# Patient Record
Sex: Male | Born: 1995 | Race: Black or African American | Hispanic: No | Marital: Single | Smoking: Never smoker
Health system: Southern US, Community
[De-identification: ages and names within clinical notes are randomized; demographics above are authoritative.]

## PROBLEM LIST (undated history)

## (undated) DIAGNOSIS — J302 Other seasonal allergic rhinitis: Secondary | ICD-10-CM

---
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
## 2014-10-14 ENCOUNTER — Emergency Department (HOSPITAL_COMMUNITY)
Admission: EM | Admit: 2014-10-14 | Discharge: 2014-10-14 | Disposition: A | Discharge status: Home / Self Care | Payer: Medicaid Other | Attending: Emergency Medicine | Admitting: Emergency Medicine

## 2014-10-14 ENCOUNTER — Encounter (HOSPITAL_COMMUNITY): Payer: Self-pay | Admitting: Cardiology

## 2014-10-14 ENCOUNTER — Emergency Department (HOSPITAL_COMMUNITY): Payer: Medicaid Other

## 2014-10-14 DIAGNOSIS — Z880 Allergy status to penicillin: Secondary | ICD-10-CM | POA: Insufficient documentation

## 2014-10-14 DIAGNOSIS — E119 Type 2 diabetes mellitus without complications: Secondary | ICD-10-CM | POA: Insufficient documentation

## 2014-10-14 DIAGNOSIS — Z79899 Other long term (current) drug therapy: Secondary | ICD-10-CM | POA: Insufficient documentation

## 2014-10-14 DIAGNOSIS — R0789 Other chest pain: Secondary | ICD-10-CM | POA: Insufficient documentation

## 2014-10-14 LAB — I-STAT CHEM 8, ED
BUN: 13 mg/dL (ref 6–23)
CREATININE: 0.8 mg/dL (ref 0.50–1.35)
Calcium, Ion: 1.24 mmol/L — ABNORMAL HIGH (ref 1.12–1.23)
Chloride: 103 mmol/L (ref 96–112)
GLUCOSE: 98 mg/dL (ref 70–99)
HCT: 51 % (ref 39.0–52.0)
Hemoglobin: 17.3 g/dL — ABNORMAL HIGH (ref 13.0–17.0)
POTASSIUM: 4.1 mmol/L (ref 3.5–5.1)
SODIUM: 141 mmol/L (ref 135–145)
TCO2: 22 mmol/L (ref 0–100)

## 2014-10-14 LAB — I-STAT TROPONIN, ED: Troponin i, poc: 0 ng/mL (ref 0.00–0.08)

## 2014-10-14 MED ORDER — IBUPROFEN 200 MG PO TABS: 200.0000 mg | ORAL_TABLET | Freq: Four times a day (QID) | ORAL | Status: AC | PRN

## 2014-10-14 MED ORDER — RANITIDINE HCL 150 MG PO TABS: 150.0000 mg | ORAL_TABLET | Freq: Two times a day (BID) | ORAL | Status: AC

## 2014-10-14 MED ORDER — OXYCODONE-ACETAMINOPHEN 5-325 MG PO TABS
1.0000 | ORAL_TABLET | Freq: Once | ORAL | Status: AC
  Administered 2014-10-14: 1 via ORAL
  Filled 2014-10-14: qty 1

## 2014-10-14 NOTE — ED Provider Notes (Signed)
CSN: [id number]     Arrival date & time 10/14/14  1044 History   First MD Initiated Contact with Patient 10/14/14 1100     Chief Complaint  Patient presents with  . Chest Pain     (Consider location/radiation/quality/duration/timing/severity/associated sxs/prior Treatment) HPI Bradley Lucero is a 19 y.o. male comes in for evaluation of chest discomfort. Patient states he has had chest discomfort intermittently for the past 2 or 3 weeks. His most recent episode occurred this morning at 8:00 AM he characterizes as a left chest pressure sensation that lasted approximately one hour. Nothing made it better or worse. He reports associated shortness of breath during the event. He denies headache, vision changes, diaphoresis, numbness or weakness, syncope, abdominal pain, nausea or vomiting. He reports discomfort now is a 3/10. PERC negative.  Past Medical History  Diagnosis Date  . Seasonal allergies   . Diabetes mellitus without complication     took metformin as a child, no longer has DM   Past Surgical History  Procedure Laterality Date  . Nasal septoplasty w/ turbinoplasty  05/05/2012    Procedure: NASAL SEPTOPLASTY WITH TURBINATE REDUCTION;  Surgeon: Darletta MollSui W Teoh, MD;  Location: [facility];  Service: ENT;  Laterality: Bilateral;   History reviewed. No pertinent family history. History  Substance Use Topics  . Smoking status: Never Smoker   . Smokeless tobacco: Never Used  . Alcohol Use: No    Review of Systems A 10 point review of systems was completed and was negative except for pertinent positives and negatives as mentioned in the history of present illness    Allergies  Shellfish allergy and Penicillins  Home Medications   Prior to Admission medications   Medication Sig Start Date End Date Taking? Authorizing Provider  Ascorbic Acid (VITAMIN C PO) Take 1 tablet by mouth daily.   Yes Historical Provider, MD  HYDROcodone-acetaminophen (NORCO/VICODIN) 5-325 MG  per tablet Take 1-2 tablets by mouth every 6 (six) hours as needed for pain. Patient not taking: Reported on 10/14/2014 05/05/12   Newman PiesSu Teoh, MD  ibuprofen (ADVIL,MOTRIN) 200 MG tablet Take 1 tablet (200 mg total) by mouth every 6 (six) hours as needed for mild pain or moderate pain. 10/14/14   Joycie PeekBenjamin Dewanna Hurston, PA-C  ranitidine (ZANTAC) 150 MG tablet Take 1 tablet (150 mg total) by mouth 2 (two) times daily. 10/14/14   Raju Coppolino, PA-C   BP 139/66 mmHg  Pulse 70  Temp(Src) 98.1 F (36.7 C) (Oral)  Resp 14  Wt 218 lb 5 oz (99.026 kg)  SpO2 98% Physical Exam  Constitutional: He is oriented to person, place, and time. He appears well-developed and well-nourished.  HENT:  Head: Normocephalic and atraumatic.  Mouth/Throat: Oropharynx is clear and moist.  Eyes: Conjunctivae are normal. Pupils are equal, round, and reactive to light. Right eye exhibits no discharge. Left eye exhibits no discharge. No scleral icterus.  Neck: Normal range of motion. Neck supple.  Cardiovascular: Normal rate, regular rhythm and normal heart sounds.  Exam reveals no gallop and no friction rub.   No murmur heard. Pulmonary/Chest: Effort normal and breath sounds normal. No respiratory distress. He has no wheezes. He has no rales. He exhibits no tenderness.  Abdominal: Soft. He exhibits no distension and no mass. There is no tenderness. There is no rebound and no guarding.  Musculoskeletal: He exhibits no tenderness.  Bilateral lower extremities symmetric without appreciable edema, erythema. No tenderness along the deep venous system.  Neurological: He is alert and  oriented to person, place, and time.  Cranial Nerves II-XII grossly intact  Skin: Skin is warm and dry. No rash noted.  Psychiatric: He has a normal mood and affect.  Nursing note and vitals reviewed.   ED Course  Procedures (including critical care time) Labs Review Labs Reviewed  I-STAT CHEM 8, ED - Abnormal; Notable for the following:     Calcium, Ion 1.24 (*)    Hemoglobin 17.3 (*)    All other components within normal limits  I-STAT TROPOININ, ED    Imaging Review Dg Chest 2 View  10/14/2014   CLINICAL DATA:  Chest pain  EXAM: CHEST  2 VIEW  COMPARISON:  None.  FINDINGS: The heart size and mediastinal contours are within normal limits. Both lungs are clear. The visualized skeletal structures are unremarkable.  IMPRESSION: No active cardiopulmonary disease.   Electronically Signed   By: Natasha Mead M.D.   On: 10/14/2014 12:45     EKG Interpretation   Date/Time:  Thursday October 14 2014 10:48:59 EDT Ventricular Rate:  102 PR Interval:  120 QRS Duration: 106 QT Interval:  330 QTC Calculation: 430 R Axis:   100 Text Interpretation:  Sinus tachycardia Rightward axis non-spec t wave  abn's Confirmed by HARRISON  MD, FORREST ([id number]) on 10/14/2014 11:23:32 AM     Meds given in ED:  Medications  oxyCODONE-acetaminophen (PERCOCET/ROXICET) 5-325 MG per tablet 1 tablet (1 tablet Oral Given 10/14/14 1333)    New Prescriptions   RANITIDINE (ZANTAC) 150 MG TABLET    Take 1 tablet (150 mg total) by mouth 2 (two) times daily.   Filed Vitals:   10/14/14 1133 10/14/14 1145 10/14/14 1345 10/14/14 1430  BP: 143/83 142/81 142/78 139/66  Pulse: 89 82 87 70  Temp:      TempSrc:      Resp: Weight:      SpO2: 99% 98% 100% 98%    MDM  Vitals stable - WNL -afebrile Pt resting comfortably in ED.  PE--Lung exam normal. Cardiac auscultation reveals no murmurs rubs or gallops. Grossly Benign Physical Exam Labwork: Troponin negative. EKG with nonspecific T-wave inversions--discussed with Dr. Romeo Apple and not concerning. Labs otherwise noncontributory Imaging: CXR shows no acute cardiopulmonary pathology  DDX: Patient with nonspecific/atypical chest discomfort for 2-3 weeks. Clinical picture and exam today not consistent with ACS/dissection. Heart score 1. No evidence of spontaneous pneumothorax, esophageal rupture or  other mediastinitis. PERC negative, doubt PE. No evidence of myocarditis, endocarditis, pericarditis.  Will DC with Zantac and anti-inflammatories and instructions to f/u with PCP. I discussed all relevant lab findings and imaging results with pt and they verbalized understanding. Discussed f/u with PCP within 48 hrs and return precautions, pt very amenable to plan. Prior to patient discharge, I discussed and reviewed this case with Dr. Romeo Apple, who also saw and evaluated the patient   Final diagnoses:  Chest discomfort        Joycie Peek, PA-C 10/14/14 2118  Purvis Sheffield, MD 10/14/14 2124

## 2014-10-14 NOTE — Discharge Instructions (Signed)
Chest Pain (Nonspecific) °It is often hard to give a specific diagnosis for the cause of chest pain. There is always a chance that your pain could be related to something serious, such as a heart attack or a blood clot in the lungs. You need to follow up with your health care provider for further evaluation. °CAUSES  °· Heartburn. °· Pneumonia or bronchitis. °· Anxiety or stress. °· Inflammation around your heart (pericarditis) or lung (pleuritis or pleurisy). °· A blood clot in the lung. °· A collapsed lung (pneumothorax). It can develop suddenly on its own (spontaneous pneumothorax) or from trauma to the chest. °· Shingles infection (herpes zoster virus). °The chest wall is composed of bones, muscles, and cartilage. Any of these can be the source of the pain. °· The bones can be bruised by injury. °· The muscles or cartilage can be strained by coughing or overwork. °· The cartilage can be affected by inflammation and become sore (costochondritis). °DIAGNOSIS  °Lab tests or other studies may be needed to find the cause of your pain. Your health care provider may have you take a test called an ambulatory electrocardiogram (ECG). An ECG records your heartbeat patterns over a 24-hour period. You may also have other tests, such as: °· Transthoracic echocardiogram (TTE). During echocardiography, sound waves are used to evaluate how blood flows through your heart. °· Transesophageal echocardiogram (TEE). °· Cardiac monitoring. This allows your health care provider to monitor your heart rate and rhythm in real time. °· Holter monitor. This is a portable device that records your heartbeat and can help diagnose heart arrhythmias. It allows your health care provider to track your heart activity for several days, if needed. °· Stress tests by exercise or by giving medicine that makes the heart beat faster. °TREATMENT  °· Treatment depends on what may be causing your chest pain. Treatment may include: °¨ Acid blockers for  heartburn. °¨ Anti-inflammatory medicine. °¨ Pain medicine for inflammatory conditions. °¨ Antibiotics if an infection is present. °· You may be advised to change lifestyle habits. This includes stopping smoking and avoiding alcohol, caffeine, and chocolate. °· You may be advised to keep your head raised (elevated) when sleeping. This reduces the chance of acid going backward from your stomach into your esophagus. °Most of the time, nonspecific chest pain will improve within 2-3 days with rest and mild pain medicine.  °HOME CARE INSTRUCTIONS  °· If antibiotics were prescribed, take them as directed. Finish them even if you start to feel better. °· For the next few days, avoid physical activities that bring on chest pain. Continue physical activities as directed. °· Do not use any tobacco products, including cigarettes, chewing tobacco, or electronic cigarettes. °· Avoid drinking alcohol. °· Only take medicine as directed by your health care provider. °· Follow your health care provider's suggestions for further testing if your chest pain does not go away. °· Keep any follow-up appointments you made. If you do not go to an appointment, you could develop lasting (chronic) problems with pain. If there is any problem keeping an appointment, call to reschedule. °SEEK MEDICAL CARE IF:  °· Your chest pain does not go away, even after treatment. °· You have a rash with blisters on your chest. °· You have a fever. °SEEK IMMEDIATE MEDICAL CARE IF:  °· You have increased chest pain or pain that spreads to your arm, neck, jaw, back, or abdomen. °· You have shortness of breath. °· You have an increasing cough, or you cough   up blood.  You have severe back or abdominal pain.  You feel nauseous or vomit.  You have severe weakness.  You faint.  You have chills. This is an emergency. Do not wait to see if the pain will go away. Get medical help at once. Call your local emergency services (911 in [place] Do not drive  yourself to the hospital. MAKE SURE YOU:   Understand these instructions.  Will watch your condition.  Will get help right away if you are not doing well or get worse. Document Released: 03/21/2005 Document Revised: 06/16/2013 Document Reviewed: 01/15/2008 [facility] Patient Information 2015 [employer] [place]. This information is not intended to replace advice given to you by your health care provider. Make sure you discuss any questions you have with your health care provider. You were evaluated in the ED today for your chest discomfort. There does not appear to be an emergent cause for your symptoms at this time. It is important. Follow-up with primary care for further evaluation and management of your symptoms. Please take all your medications as directed. Return to ED for new or worsening symptoms.

## 2014-10-14 NOTE — ED Notes (Signed)
Pt reports chest pain that started a couple of days ago. Reports some SOB with the pain but no nausea. Denies any hx of the same.

## 2014-11-04 ENCOUNTER — Emergency Department (HOSPITAL_COMMUNITY)
Admission: EM | Admit: 2014-11-04 | Discharge: 2014-11-05 | Disposition: A | Discharge status: Home / Self Care | Payer: No Typology Code available for payment source | Attending: Emergency Medicine | Admitting: Emergency Medicine

## 2014-11-04 ENCOUNTER — Emergency Department (HOSPITAL_COMMUNITY): Payer: No Typology Code available for payment source

## 2014-11-04 ENCOUNTER — Encounter (HOSPITAL_COMMUNITY): Payer: Self-pay | Admitting: Emergency Medicine

## 2014-11-04 DIAGNOSIS — Y9389 Activity, other specified: Secondary | ICD-10-CM | POA: Insufficient documentation

## 2014-11-04 DIAGNOSIS — S199XXA Unspecified injury of neck, initial encounter: Secondary | ICD-10-CM | POA: Diagnosis present

## 2014-11-04 DIAGNOSIS — Y998 Other external cause status: Secondary | ICD-10-CM | POA: Diagnosis not present

## 2014-11-04 DIAGNOSIS — S8011XA Contusion of right lower leg, initial encounter: Secondary | ICD-10-CM | POA: Diagnosis not present

## 2014-11-04 DIAGNOSIS — Y92410 Unspecified street and highway as the place of occurrence of the external cause: Secondary | ICD-10-CM | POA: Insufficient documentation

## 2014-11-04 DIAGNOSIS — Z79899 Other long term (current) drug therapy: Secondary | ICD-10-CM | POA: Diagnosis not present

## 2014-11-04 DIAGNOSIS — S161XXA Strain of muscle, fascia and tendon at neck level, initial encounter: Secondary | ICD-10-CM | POA: Diagnosis not present

## 2014-11-04 DIAGNOSIS — Z880 Allergy status to penicillin: Secondary | ICD-10-CM | POA: Diagnosis not present

## 2014-11-04 DIAGNOSIS — S8012XA Contusion of left lower leg, initial encounter: Secondary | ICD-10-CM | POA: Diagnosis not present

## 2014-11-04 DIAGNOSIS — E119 Type 2 diabetes mellitus without complications: Secondary | ICD-10-CM | POA: Diagnosis not present

## 2014-11-04 LAB — CBC
HCT: 43.3 % (ref 39.0–52.0)
Hemoglobin: 14.8 g/dL (ref 13.0–17.0)
MCH: 25.4 pg — AB (ref 26.0–34.0)
MCHC: 34.2 g/dL (ref 30.0–36.0)
MCV: 74.3 fL — AB (ref 78.0–100.0)
PLATELETS: 284 10*3/uL (ref 150–400)
RBC: 5.83 MIL/uL — ABNORMAL HIGH (ref 4.22–5.81)
RDW: 13.9 % (ref 11.5–15.5)
WBC: 6.9 10*3/uL (ref 4.0–10.5)

## 2014-11-04 LAB — BASIC METABOLIC PANEL
Anion gap: 10 (ref 5–15)
BUN: 12 mg/dL (ref 6–20)
CALCIUM: 9.8 mg/dL (ref 8.9–10.3)
CO2: 24 mmol/L (ref 22–32)
CREATININE: 0.89 mg/dL (ref 0.61–1.24)
Chloride: 103 mmol/L (ref 101–111)
GFR calc Af Amer: >60 mL/min (ref >60)
GLUCOSE: 103 mg/dL — AB (ref 65–99)
Potassium: 3.7 mmol/L (ref 3.5–5.1)
Sodium: 137 mmol/L (ref 135–145)

## 2014-11-04 MED ORDER — ONDANSETRON HCL 4 MG/2ML IJ SOLN
4.0000 mg | Freq: Once | INTRAMUSCULAR | Status: AC
  Administered 2014-11-04: 4 mg via INTRAVENOUS
  Filled 2014-11-04: qty 2

## 2014-11-04 MED ORDER — FENTANYL CITRATE (PF) 100 MCG/2ML IJ SOLN
50.0000 ug | Freq: Once | INTRAMUSCULAR | Status: AC
  Administered 2014-11-04: 50 ug via INTRAVENOUS
  Filled 2014-11-04: qty 2
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
## 2014-11-05 MED ORDER — TRAMADOL HCL 50 MG PO TABS: 50.0000 mg | ORAL_TABLET | Freq: Four times a day (QID) | ORAL | Status: AC | PRN

## 2014-11-05 MED ORDER — NAPROXEN 500 MG PO TABS: 500.0000 mg | ORAL_TABLET | Freq: Two times a day (BID) | ORAL | Status: AC
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
## 2014-11-10 ENCOUNTER — Encounter: Payer: Self-pay | Admitting: Emergency Medicine

## 2014-11-10 ENCOUNTER — Other Ambulatory Visit: Payer: Self-pay

## 2014-11-10 DIAGNOSIS — S3991XA Unspecified injury of abdomen, initial encounter: Secondary | ICD-10-CM | POA: Diagnosis not present

## 2014-11-10 DIAGNOSIS — Y939 Activity, unspecified: Secondary | ICD-10-CM | POA: Insufficient documentation

## 2014-11-10 DIAGNOSIS — Z79899 Other long term (current) drug therapy: Secondary | ICD-10-CM | POA: Insufficient documentation

## 2014-11-10 DIAGNOSIS — Y998 Other external cause status: Secondary | ICD-10-CM | POA: Diagnosis not present

## 2014-11-10 DIAGNOSIS — Z880 Allergy status to penicillin: Secondary | ICD-10-CM | POA: Insufficient documentation

## 2014-11-10 DIAGNOSIS — S299XXA Unspecified injury of thorax, initial encounter: Secondary | ICD-10-CM | POA: Diagnosis not present

## 2014-11-10 DIAGNOSIS — Y92410 Unspecified street and highway as the place of occurrence of the external cause: Secondary | ICD-10-CM | POA: Diagnosis not present

## 2014-11-10 LAB — COMPREHENSIVE METABOLIC PANEL
ALK PHOS: 63 U/L (ref 38–126)
ALT: 26 U/L (ref 17–63)
ANION GAP: 9 (ref 5–15)
AST: 24 U/L (ref 15–41)
Albumin: 4.4 g/dL (ref 3.5–5.0)
BUN: 10 mg/dL (ref 6–20)
CHLORIDE: 103 mmol/L (ref 101–111)
CO2: 29 mmol/L (ref 22–32)
Calcium: 9.9 mg/dL (ref 8.9–10.3)
Creatinine, Ser: 0.75 mg/dL (ref 0.61–1.24)
GFR calc non Af Amer: >60 mL/min (ref >60)
GLUCOSE: 101 mg/dL — AB (ref 65–99)
POTASSIUM: 3.9 mmol/L (ref 3.5–5.1)
Sodium: 141 mmol/L (ref 135–145)
TOTAL PROTEIN: 7.6 g/dL (ref 6.5–8.1)
Total Bilirubin: 0.6 mg/dL (ref 0.3–1.2)

## 2014-11-10 LAB — URINALYSIS COMPLETE WITH MICROSCOPIC (ARMC ONLY)
Bacteria, UA: NONE SEEN
Bilirubin Urine: NEGATIVE
Glucose, UA: NEGATIVE mg/dL
Ketones, ur: NEGATIVE mg/dL
LEUKOCYTES UA: NEGATIVE
Nitrite: NEGATIVE
PH: 6 (ref 5.0–8.0)
PROTEIN: NEGATIVE mg/dL
SQUAMOUS EPITHELIAL / LPF: NONE SEEN
Specific Gravity, Urine: 1.006 (ref 1.005–1.030)

## 2014-11-10 LAB — TROPONIN I

## 2014-11-10 LAB — LIPASE, BLOOD: Lipase: 25 U/L (ref 22–51)

## 2014-11-10 LAB — CBC
HCT: 45.5 % (ref 40.0–52.0)
Hemoglobin: 14.8 g/dL (ref 13.0–18.0)
MCH: 25.1 pg — ABNORMAL LOW (ref 26.0–34.0)
MCHC: 32.5 g/dL (ref 32.0–36.0)
MCV: 77 fL — AB (ref 80.0–100.0)
Platelets: 247 10*3/uL (ref 150–440)
RBC: 5.9 MIL/uL (ref 4.40–5.90)
RDW: 14.1 % (ref 11.5–14.5)
WBC: 5.3 10*3/uL (ref 3.8–10.6)
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
## 2014-11-11 ENCOUNTER — Emergency Department
Admission: EM | Admit: 2014-11-11 | Discharge: 2014-11-11 | Disposition: A | Discharge status: Home / Self Care | Payer: No Typology Code available for payment source | Attending: Emergency Medicine | Admitting: Emergency Medicine

## 2014-11-11 ENCOUNTER — Emergency Department: Payer: No Typology Code available for payment source

## 2014-11-11 DIAGNOSIS — S3991XA Unspecified injury of abdomen, initial encounter: Secondary | ICD-10-CM

## 2014-11-11 DIAGNOSIS — S298XXA Other specified injuries of thorax, initial encounter: Secondary | ICD-10-CM

## 2014-11-11 MED ORDER — OXYCODONE-ACETAMINOPHEN 5-325 MG PO TABS: 1.0000 | ORAL_TABLET | ORAL | Status: AC | PRN

## 2014-11-11 MED ORDER — DIAZEPAM 2 MG PO TABS: 2.0000 mg | ORAL_TABLET | Freq: Three times a day (TID) | ORAL | Status: AC | PRN

## 2014-11-11 MED ORDER — IOHEXOL 350 MG/ML SOLN
125.0000 mL | Freq: Once | INTRAVENOUS | Status: AC | PRN
  Administered 2014-11-11: 125 mL via INTRAVENOUS

## 2014-11-11 MED ORDER — ONDANSETRON HCL 4 MG/2ML IJ SOLN
4.0000 mg | Freq: Once | INTRAMUSCULAR | Status: AC
  Administered 2014-11-11: 4 mg via INTRAVENOUS

## 2014-11-11 MED ORDER — SODIUM CHLORIDE 0.9 % IV BOLUS (SEPSIS)
1000.0000 mL | Freq: Once | INTRAVENOUS | Status: AC
  Administered 2014-11-11: 1000 mL via INTRAVENOUS

## 2014-11-11 MED ORDER — MORPHINE SULFATE 4 MG/ML IJ SOLN
4.0000 mg | Freq: Once | INTRAMUSCULAR | Status: AC
  Administered 2014-11-11: 4 mg via INTRAVENOUS

## 2014-11-11 MED ORDER — ONDANSETRON HCL 4 MG/2ML IJ SOLN
INTRAMUSCULAR | Status: AC
  Administered 2014-11-11: 4 mg via INTRAVENOUS
  Filled 2014-11-11: qty 2

## 2014-11-11 MED ORDER — MORPHINE SULFATE 4 MG/ML IJ SOLN
INTRAMUSCULAR | Status: AC
  Administered 2014-11-11: 4 mg via INTRAVENOUS
  Filled 2014-11-11: qty 1
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
## 2024-01-04 DIAGNOSIS — Z419 Encounter for procedure for purposes other than remedying health state, unspecified: Secondary | ICD-10-CM | POA: Diagnosis not present

## 2024-02-04 DIAGNOSIS — Z419 Encounter for procedure for purposes other than remedying health state, unspecified: Secondary | ICD-10-CM | POA: Diagnosis not present

## 2024-03-06 DIAGNOSIS — Z419 Encounter for procedure for purposes other than remedying health state, unspecified: Secondary | ICD-10-CM | POA: Diagnosis not present

## 2024-05-28 DIAGNOSIS — H5213 Myopia, bilateral: Secondary | ICD-10-CM | POA: Diagnosis not present
